# Patient Record
Sex: Female | Born: 1990 | Race: Black or African American | Hispanic: No | Marital: Single | Smoking: Never smoker
Health system: Southern US, Community
[De-identification: ages and names within clinical notes are randomized; demographics above are authoritative.]

---

## 2015-08-01 ENCOUNTER — Emergency Department (HOSPITAL_COMMUNITY): Payer: 59

## 2015-08-01 ENCOUNTER — Encounter (HOSPITAL_COMMUNITY): Payer: Self-pay | Admitting: *Deleted

## 2015-08-01 ENCOUNTER — Emergency Department (HOSPITAL_COMMUNITY)
Admission: EM | Admit: 2015-08-01 | Discharge: 2015-08-01 | Disposition: A | Discharge status: Home / Self Care | Payer: 59 | Attending: Emergency Medicine | Admitting: Emergency Medicine

## 2015-08-01 DIAGNOSIS — Z3202 Encounter for pregnancy test, result negative: Secondary | ICD-10-CM | POA: Diagnosis not present

## 2015-08-01 DIAGNOSIS — R1031 Right lower quadrant pain: Secondary | ICD-10-CM

## 2015-08-01 DIAGNOSIS — N83209 Unspecified ovarian cyst, unspecified side: Secondary | ICD-10-CM

## 2015-08-01 DIAGNOSIS — N200 Calculus of kidney: Secondary | ICD-10-CM | POA: Insufficient documentation

## 2015-08-01 DIAGNOSIS — N8300 Follicular cyst of ovary, unspecified side: Secondary | ICD-10-CM | POA: Diagnosis not present

## 2015-08-01 LAB — CBC
HEMATOCRIT: 39.4 % (ref 36.0–46.0)
HEMOGLOBIN: 13.2 g/dL (ref 12.0–15.0)
MCH: 28.4 pg (ref 26.0–34.0)
MCHC: 33.5 g/dL (ref 30.0–36.0)
MCV: 84.9 fL (ref 78.0–100.0)
Platelets: 234 10*3/uL (ref 150–400)
RBC: 4.64 MIL/uL (ref 3.87–5.11)
RDW: 12.9 % (ref 11.5–15.5)
WBC: 8.3 10*3/uL (ref 4.0–10.5)

## 2015-08-01 LAB — BASIC METABOLIC PANEL
Anion gap: 10 (ref 5–15)
BUN: 10 mg/dL (ref 6–20)
CHLORIDE: 107 mmol/L (ref 101–111)
CO2: 22 mmol/L (ref 22–32)
CREATININE: 0.84 mg/dL (ref 0.44–1.00)
Calcium: 9.7 mg/dL (ref 8.9–10.3)
GFR calc non Af Amer: >60 mL/min (ref >60)
Glucose, Bld: 109 mg/dL — ABNORMAL HIGH (ref 65–99)
POTASSIUM: 3.4 mmol/L — AB (ref 3.5–5.1)
SODIUM: 139 mmol/L (ref 135–145)

## 2015-08-01 LAB — URINE MICROSCOPIC-ADD ON

## 2015-08-01 LAB — URINALYSIS, ROUTINE W REFLEX MICROSCOPIC
BILIRUBIN URINE: NEGATIVE
Glucose, UA: NEGATIVE mg/dL
Ketones, ur: 15 mg/dL — AB
Leukocytes, UA: NEGATIVE
Nitrite: NEGATIVE
PH: 6 (ref 5.0–8.0)
Protein, ur: NEGATIVE mg/dL
SPECIFIC GRAVITY, URINE: 1.021 (ref 1.005–1.030)
Urobilinogen, UA: 0.2 mg/dL (ref 0.0–1.0)

## 2015-08-01 LAB — I-STAT BETA HCG BLOOD, ED (MC, WL, AP ONLY)

## 2015-08-01 MED ORDER — HYDROCODONE-ACETAMINOPHEN 5-325 MG PO TABS: 1.0000 | ORAL_TABLET | Freq: Four times a day (QID) | ORAL | Status: AC | PRN

## 2015-08-01 MED ORDER — HYDROMORPHONE HCL 1 MG/ML IJ SOLN
1.0000 mg | Freq: Once | INTRAMUSCULAR | Status: AC
  Administered 2015-08-01: 1 mg via INTRAVENOUS
  Filled 2015-08-01: qty 1

## 2015-08-01 MED ORDER — ONDANSETRON 4 MG PO TBDP: 4.0000 mg | ORAL_TABLET | Freq: Three times a day (TID) | ORAL | Status: AC | PRN

## 2015-08-01 MED ORDER — SODIUM CHLORIDE 0.9 % IV BOLUS (SEPSIS)
1000.0000 mL | Freq: Once | INTRAVENOUS | Status: AC
  Administered 2015-08-01: 1000 mL via INTRAVENOUS

## 2015-08-01 MED ORDER — ONDANSETRON HCL 4 MG/2ML IJ SOLN
4.0000 mg | Freq: Once | INTRAMUSCULAR | Status: AC
  Administered 2015-08-01: 4 mg via INTRAVENOUS
  Filled 2015-08-01: qty 2

## 2015-08-01 MED ORDER — KETOROLAC TROMETHAMINE 30 MG/ML IJ SOLN
30.0000 mg | Freq: Once | INTRAMUSCULAR | Status: AC
  Administered 2015-08-01: 30 mg via INTRAVENOUS
  Filled 2015-08-01: qty 1

## 2015-08-01 MED ORDER — IOHEXOL 300 MG/ML  SOLN
100.0000 mL | Freq: Once | INTRAMUSCULAR | Status: AC | PRN
  Administered 2015-08-01: 100 mL via INTRAVENOUS

## 2015-08-01 NOTE — ED Notes (Signed)
In room to medicate patient.  Not in room.  At ultrasound at this time.  To medicate on arrival back to room.

## 2015-08-01 NOTE — Discharge Instructions (Signed)
As discussed, with tonight's evaluation demonstrated the presence of both a hemorrhagic ovarian cyst, and a kidney stone, minor ongoing pain is to be expected.  However, if you develop new, or concerning changes in your condition is very important to follow-up here for further evaluation.  Otherwise, please follow-up with your physicians.

## 2015-08-01 NOTE — ED Notes (Signed)
Family requesting to see physician.  Dr. Jeraldine LootsLockwood made aware.

## 2015-08-01 NOTE — ED Provider Notes (Signed)
CSN: [id number]     Arrival date & time 08/01/15  1803 History   First MD Initiated Contact with Patient 08/01/15 1828     Chief Complaint  Patient presents with  . Flank Pain     (Consider location/radiation/quality/duration/timing/severity/associated sxs/prior Treatment) HPI Patient presents about 2 hours after the sudden onset of pain in the right lower quadrant. Pain is focally in the right lower quadrant, nonradiating, sore, severe, sharp. There is associated nausea, vomiting, no diarrhea, no dysuria, hematuria. Patient was well prior to the onset of pain. Patient acknowledges striking substantial amounts of alcohol yesterday. She denies a history of abdominal surgery. Patient has taken no medication for pain relief thus far, and there are no clear alleviating or exacerbating factors.  History reviewed. No pertinent past medical history. History reviewed. No pertinent past surgical history. No family history on file. Social History  Substance Use Topics  . Smoking status: Never Smoker   . Smokeless tobacco: None  . Alcohol Use: Yes     Comment: weekends   OB History    No data available     Review of Systems  Constitutional:       Per HPI, otherwise negative  HENT:       Per HPI, otherwise negative  Respiratory:       Per HPI, otherwise negative  Cardiovascular:       Per HPI, otherwise negative  Gastrointestinal: Positive for nausea and vomiting.  Endocrine:       Negative aside from HPI  Genitourinary:       Neg aside from HPI   Musculoskeletal:       Per HPI, otherwise negative  Skin: Negative.   Neurological: Negative for syncope.      Allergies  Review of patient's allergies indicates no known allergies.  Home Medications   Prior to Admission medications   Medication Sig Start Date End Date Taking? Authorizing Provider  HYDROcodone-acetaminophen (NORCO/VICODIN) 5-325 MG tablet Take 1 tablet by mouth every 6 (six) hours as needed for severe  pain. 08/01/15   Gerhard Munchobert Shekita Boyden, MD  ondansetron (ZOFRAN ODT) 4 MG disintegrating tablet Take 1 tablet (4 mg total) by mouth every 8 (eight) hours as needed for nausea or vomiting. 08/01/15   Gerhard Munchobert Jarrin Staley, MD   BP 110/96 mmHg  Pulse 80  Temp(Src) 98.7 F (37.1 C) (Oral)  Resp 18  SpO2 97%  LMP 07/18/2015 Physical Exam  Constitutional: She is oriented to person, place, and time. She appears well-developed and well-nourished. No distress.  HENT:  Head: Normocephalic and atraumatic.  Eyes: Conjunctivae and EOM are normal.  Cardiovascular: Normal rate and regular rhythm.   Pulmonary/Chest: Effort normal and breath sounds normal. No stridor. No respiratory distress.  Abdominal: She exhibits no distension. There is tenderness in the right lower quadrant.  Musculoskeletal: She exhibits no edema.  Neurological: She is alert and oriented to person, place, and time. No cranial nerve deficit.  Skin: Skin is warm and dry.  Psychiatric: She has a normal mood and affect.  Nursing note and vitals reviewed.   ED Course  Procedures (including critical care time) Labs Review Labs Reviewed  URINALYSIS, ROUTINE W REFLEX MICROSCOPIC (NOT [facility]) - Abnormal; Notable for the following:    APPearance CLOUDY (*)    Hgb urine dipstick LARGE (*)    Ketones, ur 15 (*)    All other components within normal limits  BASIC METABOLIC PANEL - Abnormal; Notable for the following:    Potassium 3.4 (*)  Glucose, Bld 109 (*)    All other components within normal limits  URINE MICROSCOPIC-ADD ON - Abnormal; Notable for the following:    Bacteria, UA FEW (*)    All other components within normal limits  CBC  I-STAT BETA HCG BLOOD, ED ([facility], [facility], [facility] ONLY)    Imaging Review US Transvaginal Non-ob  08/01/2015  CLINICAL DATA:  Acute onset RIGHT lower quadrant pain beginning at 5 p.m. RIGHT abdominal pelvic pain, assess for torsion. EXAM: TRANSABDOMINAL AND TRANSVAGINAL ULTRASOUND OF PELVIS DOPPLER ULTRASOUND  OF OVARIES TECHNIQUE: Both transabdominal and transvaginal ultrasound examinations of the pelvis were performed. Transabdominal technique was performed for global imaging of the pelvis including uterus, ovaries, adnexal regions, and pelvic cul-de-sac. It was necessary to proceed with endovaginal exam following the transabdominal exam to visualize the adnexa and endometrium. Color and duplex Doppler ultrasound was utilized to evaluate blood flow to the ovaries. COMPARISON:  CT abdomen and pelvis August 01, 2015 at 2056 hours FINDINGS: Uterus Measurements: 7.1 x 3.4 x 6.4 cm. No fibroids or other mass visualized. Endometrium Thickness: 7 mm.  No focal abnormality visualized. Right ovary Measurements: 3.2 x 2.3 x 3.2 cm. Normal appearance/no adnexal mass. Left ovary Measurements: 3.9 x 2.9 x 3.3 cm. Predominately anechoic LEFT adnexal 2.9 x 2.5 x 2.7 cm cyst with internal echoes most compatible with a hemorrhagic cyst. Pulsed Doppler evaluation of both ovaries demonstrates normal low-resistance arterial and venous waveforms. Other findings Small amount of free fluid in the pelvis, likely physiologic. IMPRESSION: No sonographic findings of ovarian torsion with particular attention to RIGHT ovary. 2.9 cm probable LEFT hemorrhagic cyst. Electronically Signed   By: Awilda Metro M.D.   On: 08/01/2015 22:45   US Pelvis Complete  08/01/2015  CLINICAL DATA:  Acute onset RIGHT lower quadrant pain beginning at 5 p.m. RIGHT abdominal pelvic pain, assess for torsion. EXAM: TRANSABDOMINAL AND TRANSVAGINAL ULTRASOUND OF PELVIS DOPPLER ULTRASOUND OF OVARIES TECHNIQUE: Both transabdominal and transvaginal ultrasound examinations of the pelvis were performed. Transabdominal technique was performed for global imaging of the pelvis including uterus, ovaries, adnexal regions, and pelvic cul-de-sac. It was necessary to proceed with endovaginal exam following the transabdominal exam to visualize the adnexa and endometrium. Color  and duplex Doppler ultrasound was utilized to evaluate blood flow to the ovaries. COMPARISON:  CT abdomen and pelvis August 01, 2015 at 2056 hours FINDINGS: Uterus Measurements: 7.1 x 3.4 x 6.4 cm. No fibroids or other mass visualized. Endometrium Thickness: 7 mm.  No focal abnormality visualized. Right ovary Measurements: 3.2 x 2.3 x 3.2 cm. Normal appearance/no adnexal mass. Left ovary Measurements: 3.9 x 2.9 x 3.3 cm. Predominately anechoic LEFT adnexal 2.9 x 2.5 x 2.7 cm cyst with internal echoes most compatible with a hemorrhagic cyst. Pulsed Doppler evaluation of both ovaries demonstrates normal low-resistance arterial and venous waveforms. Other findings Small amount of free fluid in the pelvis, likely physiologic. IMPRESSION: No sonographic findings of ovarian torsion with particular attention to RIGHT ovary. 2.9 cm probable LEFT hemorrhagic cyst. Electronically Signed   By: Awilda Metro M.D.   On: 08/01/2015 22:45   Ct Abdomen Pelvis W Contrast  08/01/2015  CLINICAL DATA:  Initial evaluation for acute right lower quadrant abdominal pain, nausea, vomiting. EXAM: CT ABDOMEN AND PELVIS WITH CONTRAST TECHNIQUE: Multidetector CT imaging of the abdomen and pelvis was performed using the standard protocol following bolus administration of intravenous contrast. CONTRAST:  OMNIPAQUE IOHEXOL 300 MG/ML  SOLN COMPARISON:  None. FINDINGS: Minimal subsegmental atelectasis seen dependently  in the left lung base. Visualized lungs are otherwise clear. No pleural or pericardial effusion. Liver demonstrates a normal contrast enhanced appearance. Gallbladder normal. No biliary dilatation. Spleen, adrenal glands, and pancreas demonstrate a normal contrast enhanced appearance. Left kidney unremarkable without evidence of nephrolithiasis or hydronephrosis. No focal enhancing renal mass. On the right, there is a somewhat delayed nephrogram as compared to the left. Additionally, there is mild fullness of the right  renal collecting system with mild right hydronephrosis. The right ureter is mildly dilated as compared to the left with periureteral fat stranding. There is an obstructive 4 mm calculus at the right UVJ. No other radiopaque stones seen along the course of the right renal collecting system or within the right kidney. Stomach within normal limits. No evidence for bowel obstruction. Appendix well visualized in the right lower quadrant and is of normal caliber and appearance without associated inflammatory changes to suggest acute appendicitis. No abnormal wall thickening, mucosal enhancement, or inflammatory fat stranding seen elsewhere about the bowels. Bladder is largely decompressed but grossly normal. Uterus within normal limits. Right ovary unremarkable. 3 cm cyst within the left ovary, likely a normal physiologic cyst. Small vault of free fluid within the pelvis, likely fib logic. No free air. No pathologically enlarged intra-abdominal or pelvic lymph nodes identified. Normal intravascular enhancement seen throughout the intra-abdominal aorta and its branch vessels. No acute osseous abnormality. No worrisome lytic or blastic osseous lesions. IMPRESSION: 1. 4 mm obstructive stone at the right UVJ with mild right hydroureteronephrosis. 2. No other acute intra-abdominal or pelvic process. Normal appendix. 3. Small volume free fluid within the pelvis, likely physiologic. Electronically Signed   By: Rise Mu M.D.   On: 08/01/2015 21:42   [place] Art/ven Flow Abd Pelv Doppler  08/01/2015  CLINICAL DATA:  Acute onset RIGHT lower quadrant pain beginning at 5 p.m. RIGHT abdominal pelvic pain, assess for torsion. EXAM: TRANSABDOMINAL AND TRANSVAGINAL ULTRASOUND OF PELVIS DOPPLER ULTRASOUND OF OVARIES TECHNIQUE: Both transabdominal and transvaginal ultrasound examinations of the pelvis were performed. Transabdominal technique was performed for global imaging of the pelvis including uterus, ovaries, adnexal  regions, and pelvic cul-de-sac. It was necessary to proceed with endovaginal exam following the transabdominal exam to visualize the adnexa and endometrium. Color and duplex Doppler ultrasound was utilized to evaluate blood flow to the ovaries. COMPARISON:  CT abdomen and pelvis August 01, 2015 at 2056 hours FINDINGS: Uterus Measurements: 7.1 x 3.4 x 6.4 cm. No fibroids or other mass visualized. Endometrium Thickness: 7 mm.  No focal abnormality visualized. Right ovary Measurements: 3.2 x 2.3 x 3.2 cm. Normal appearance/no adnexal mass. Left ovary Measurements: 3.9 x 2.9 x 3.3 cm. Predominately anechoic LEFT adnexal 2.9 x 2.5 x 2.7 cm cyst with internal echoes most compatible with a hemorrhagic cyst. Pulsed Doppler evaluation of both ovaries demonstrates normal low-resistance arterial and venous waveforms. Other findings Small amount of free fluid in the pelvis, likely physiologic. IMPRESSION: No sonographic findings of ovarian torsion with particular attention to RIGHT ovary. 2.9 cm probable LEFT hemorrhagic cyst. Electronically Signed   By: Awilda Metro M.D.   On: 08/01/2015 22:45   I have personally reviewed and evaluated these images and lab results as part of my medical decision-making.  On repeat exam after patient has returned from ultrasound and CT scan, I discussed all findings with her and her parents. Patient has the unusual combination of both kidney stone and hemorrhagic ovarian cyst. We had a lengthy conversation about likelihood of continuing pain, importance  of outpatient follow-up.    MDM   Final diagnoses:  Kidney stone  Hemorrhagic cyst of ovary    And female presents with acute onset pain. Patient is young, female, and with acute onset of pain, both appendicitis and ovarian torsion were considerations, patient was in substantial discomfort, but eventually with narcotics, fluids, Toradol, symptoms improved substantially. No evidence for bacteremia, sepsis, ovary and  torsion, appendicitis. However, the patient does have evidence for both new kidney stone, as well as hemorrhagic ovarian cyst. Patient discharged in stable condition  Gerhard Munch, MD 08/01/15 2330

## 2015-08-01 NOTE — ED Notes (Signed)
Patient unable to sit still fr vital signs.

## 2015-08-01 NOTE — ED Notes (Addendum)
Pt remains monitored by blood pressure and pulse ox. pts family remains at bedside.  Pt encouraged to provide urine specimen.

## 2015-08-01 NOTE — ED Notes (Signed)
Pt states acute onset R sided abdominal pain at 5 pm.  Emesis with pain.  States a lot of etoh last night.  No vaginal discharge.  LMP 10/16.

## 2016-06-28 IMAGING — CT CT ABD-PELV W/ CM
2 of 4 series · 15 of 46 positions shown, 17 images · IV contrast (APPLIED)
Comparison: None.

CLINICAL DATA: Initial evaluation for acute right lower quadrant
abdominal pain, nausea, vomiting.

EXAM:
CT ABDOMEN AND PELVIS WITH CONTRAST
TECHNIQUE: Multidetector CT imaging of the abdomen and pelvis was performed
using the standard protocol following bolus administration of
intravenous contrast.
CONTRAST:  100mL OMNIPAQUE IOHEXOL 300 MG/ML  SOLN

[Series 2: abd/ pelvis 5.0 i30f 1 · axial · 0.64mm/px · z∈[-466,-36]mm · 12 of 94 slices shown, 14 images]
[im 4/94  soft-tissue]
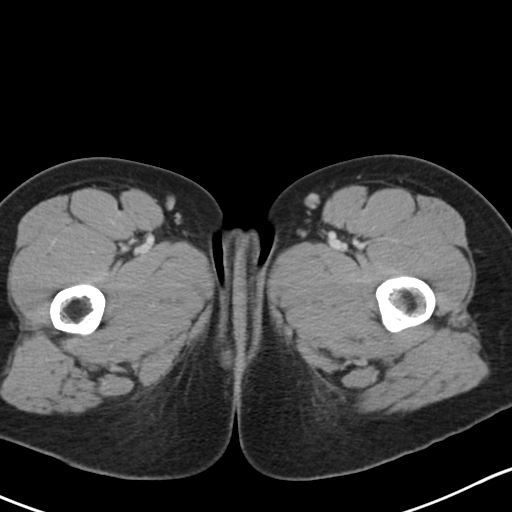
[im 4/94  bone]
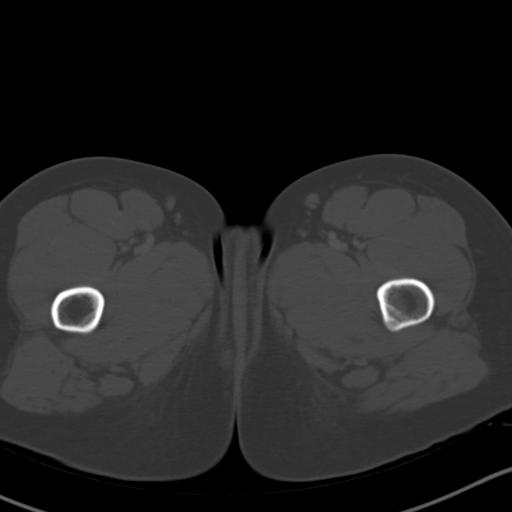
[im 12/94  soft-tissue]
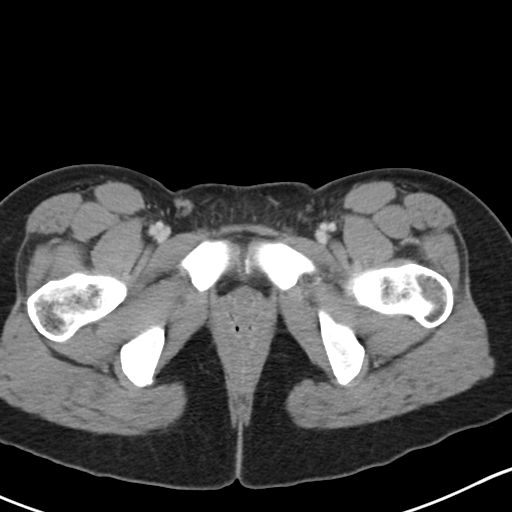
[im 20/94  soft-tissue]
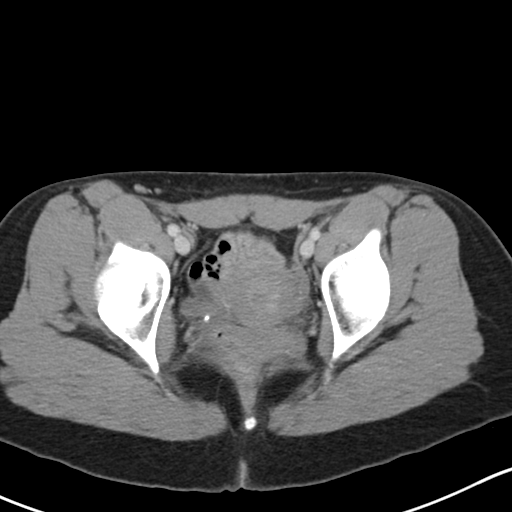
[im 28/94  soft-tissue]
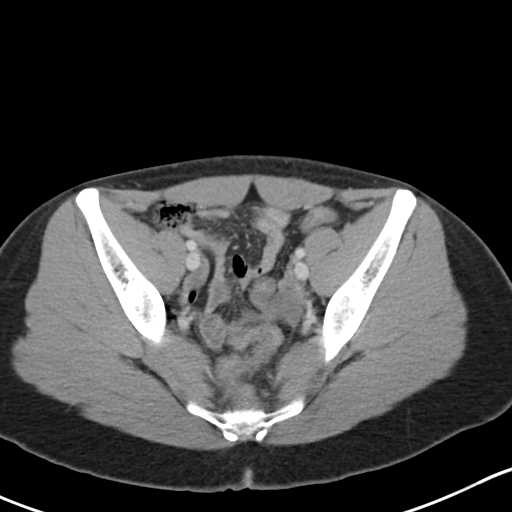
[im 35/94  soft-tissue]
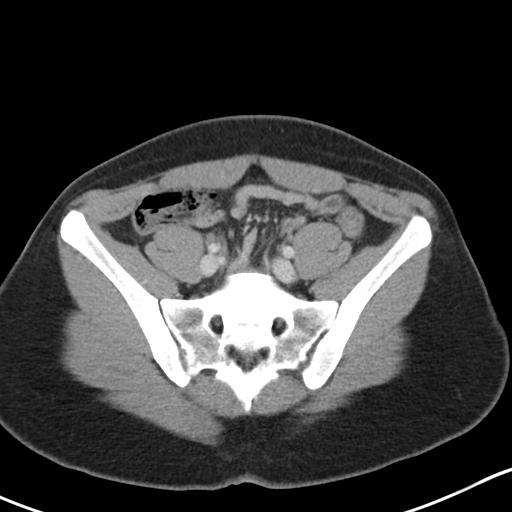
[im 43/94  soft-tissue]
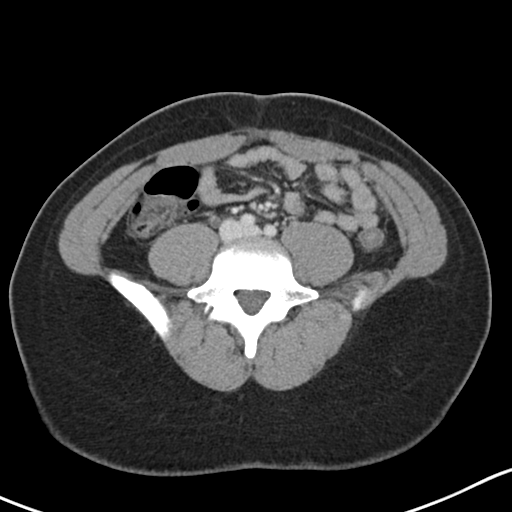
[im 51/94  soft-tissue]
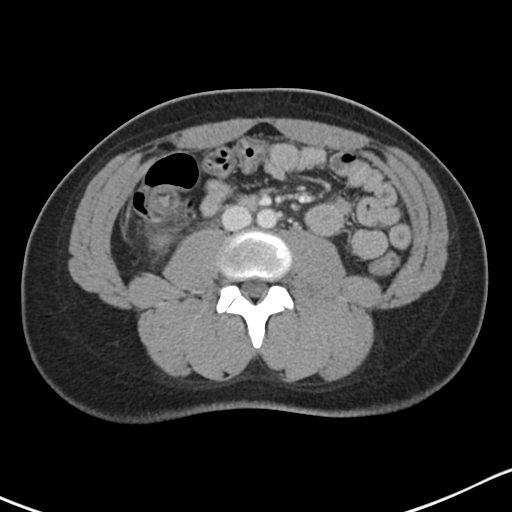
[im 59/94  soft-tissue]
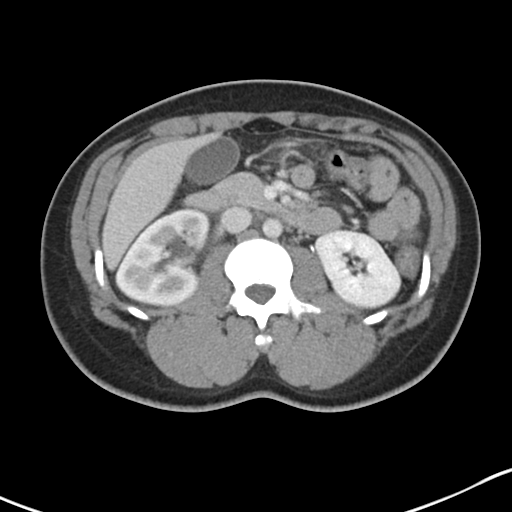
[im 66/94  soft-tissue]
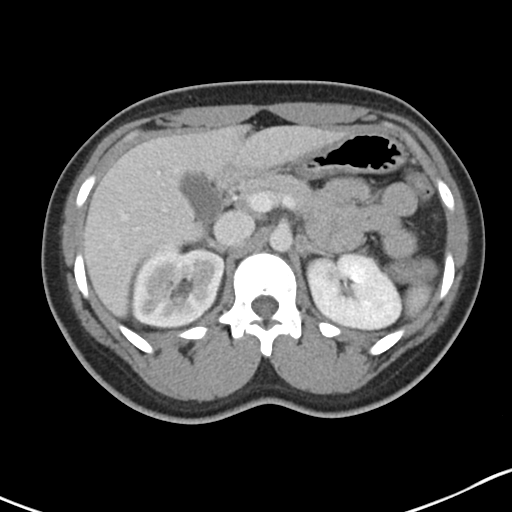
[im 66/94  bone]
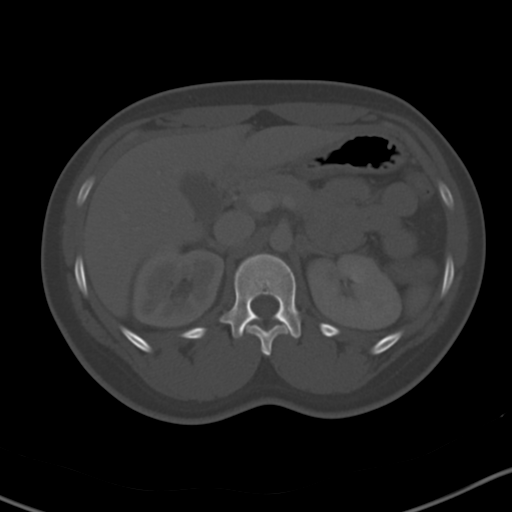
[im 74/94  soft-tissue]
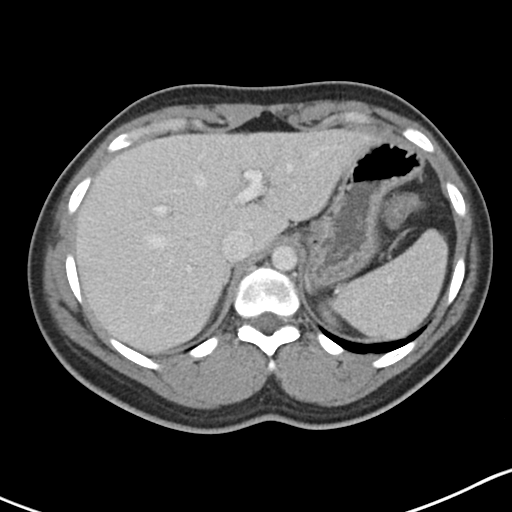
[im 82/94  soft-tissue]
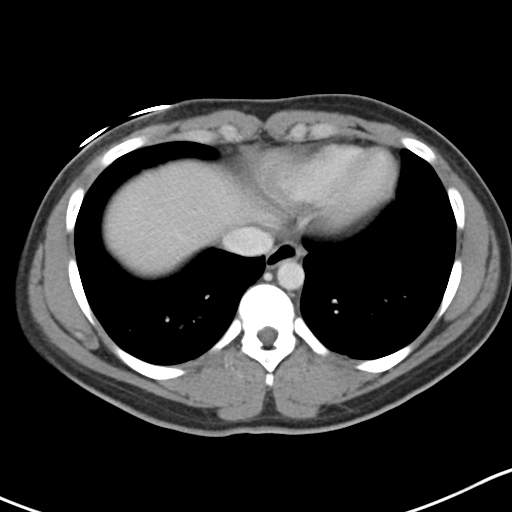
[im 90/94  soft-tissue]
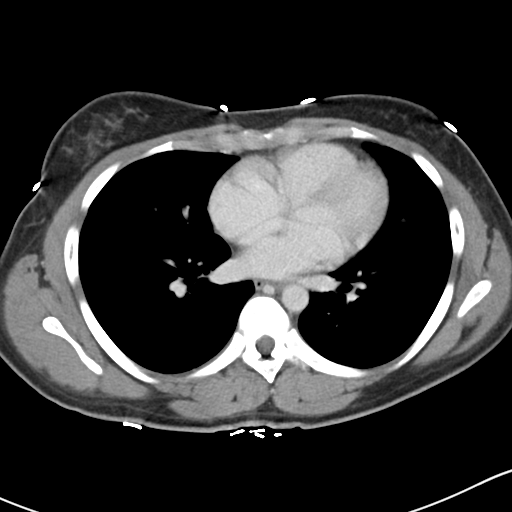

[Series 5: coronal soft tissue · coronal · 0.68mm/px · 3 of 72 slices shown]
[im 24/72  soft-tissue]
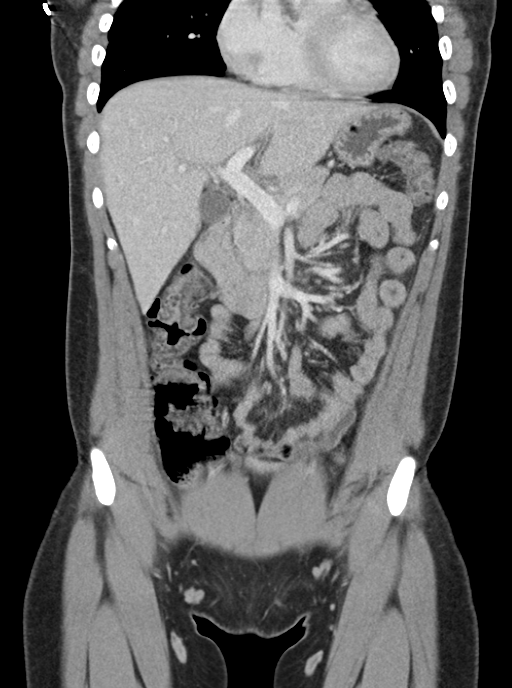
[im 32/72  soft-tissue]
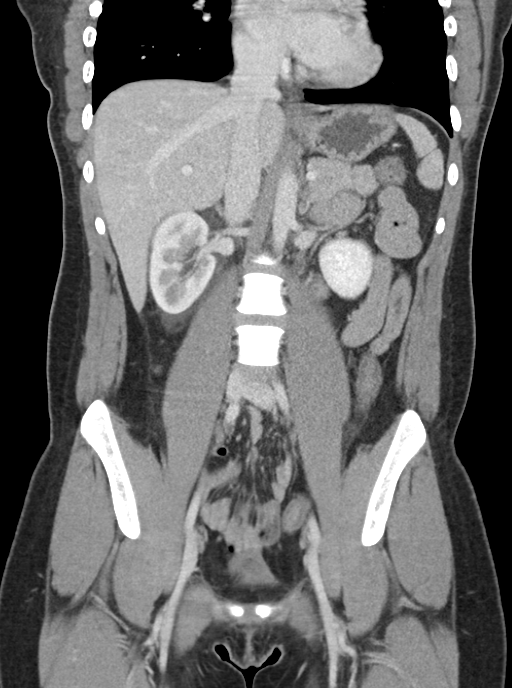
[im 40/72  soft-tissue]
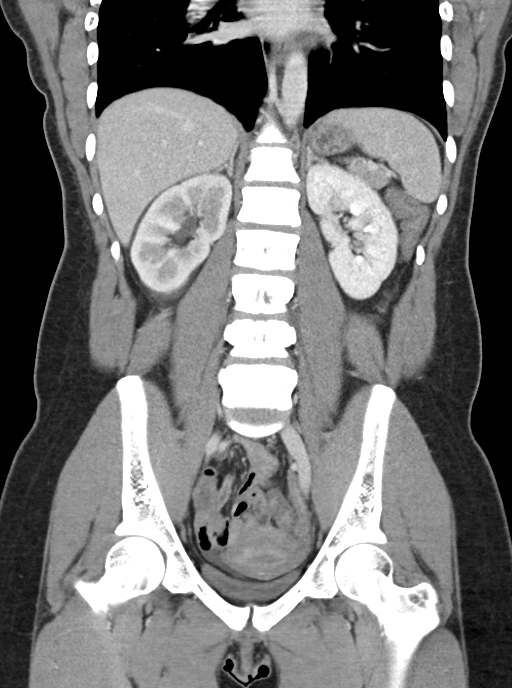

[15 of 46 positions shown; findings below may reference images not displayed]

FINDINGS: Minimal subsegmental atelectasis seen dependently in the left lung
base. Visualized lungs are otherwise clear. No pleural or
pericardial effusion.

Liver demonstrates a normal contrast enhanced appearance.
Gallbladder normal. No biliary dilatation. Spleen, adrenal glands,
and pancreas demonstrate a normal contrast enhanced appearance.

Left kidney unremarkable without evidence of nephrolithiasis or
hydronephrosis. No focal enhancing renal mass.

On the right, there is a somewhat delayed nephrogram as compared to
the left. Additionally, there is mild fullness of the right renal
collecting system with mild right hydronephrosis. The right ureter
is mildly dilated as compared to the left with periureteral fat
stranding. There is an obstructive 4 mm calculus at the right UVJ.
No other radiopaque stones seen along the course of the right renal
collecting system or within the right kidney.

Stomach within normal limits. No evidence for bowel obstruction.
Appendix well visualized in the right lower quadrant and is of
normal caliber and appearance without associated inflammatory
changes to suggest acute appendicitis. No abnormal wall thickening,
mucosal enhancement, or inflammatory fat stranding seen elsewhere
about the bowels.

Bladder is largely decompressed but grossly normal. Uterus within
normal limits. Right ovary unremarkable. 3 cm cyst within the left
ovary, likely a normal physiologic cyst.

Small vault of free fluid within the pelvis, likely fib logic. No
free air. No pathologically enlarged intra-abdominal or pelvic lymph
nodes identified. Normal intravascular enhancement seen throughout
the intra-abdominal aorta and its branch vessels.

No acute osseous abnormality. No worrisome lytic or blastic osseous
lesions.
IMPRESSION: 1. 4 mm obstructive stone at the right UVJ with mild right
hydroureteronephrosis.
2. No other acute intra-abdominal or pelvic process. Normal
appendix.
3. Small volume free fluid within the pelvis, likely physiologic.

## 2017-02-03 IMAGING — US US ART/VEN ABD/PELV/SCROTUM DOPPLER LTD
1 series · 13 of 25 positions shown · non-contrast
Comparison: CT abdomen and pelvis August 01, 2015 at 0166 hours

CLINICAL DATA: Acute onset RIGHT lower quadrant pain beginning at 5
p.m. RIGHT abdominal pelvic pain, assess for torsion.

EXAM:
TRANSABDOMINAL AND TRANSVAGINAL ULTRASOUND OF PELVIS
DOPPLER ULTRASOUND OF OVARIES
TECHNIQUE: Both transabdominal and transvaginal ultrasound examinations of the
pelvis were performed. Transabdominal technique was performed for
global imaging of the pelvis including uterus, ovaries, adnexal
regions, and pelvic cul-de-sac.
It was necessary to proceed with endovaginal exam following the
transabdominal exam to visualize the adnexa and endometrium. Color
and duplex Doppler ultrasound was utilized to evaluate blood flow to
the ovaries.

[Series 1: us art/ven abd/pelv/scrotum doppler ltd · 0.18mm/px · 13 of 55 slices shown]
[im 1/55]
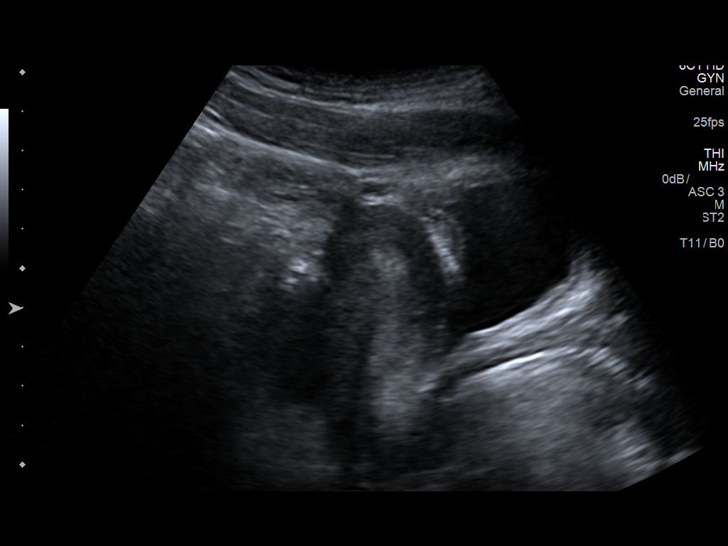
[im 5/55]
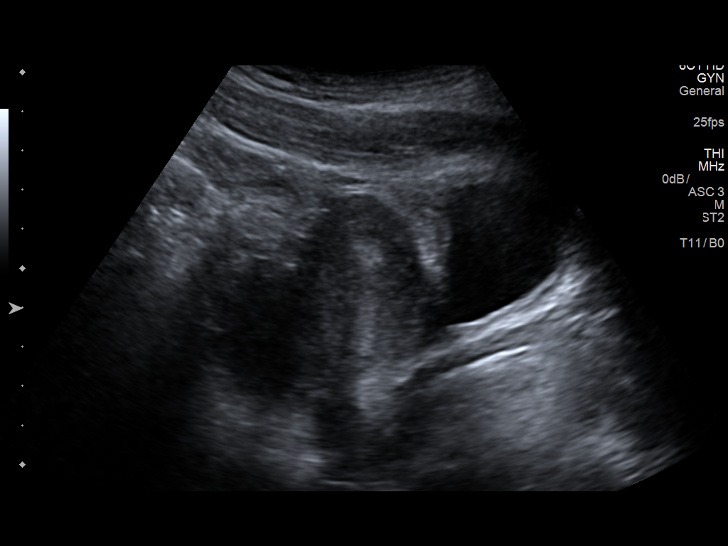
[im 10/55]
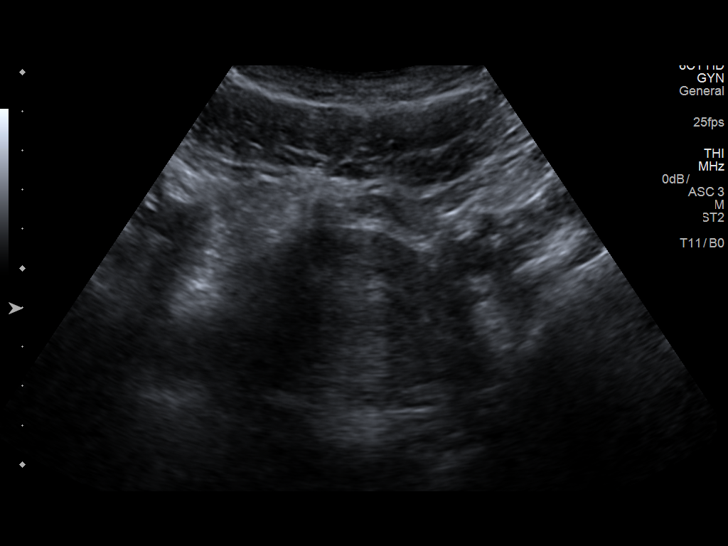
[im 14/55]
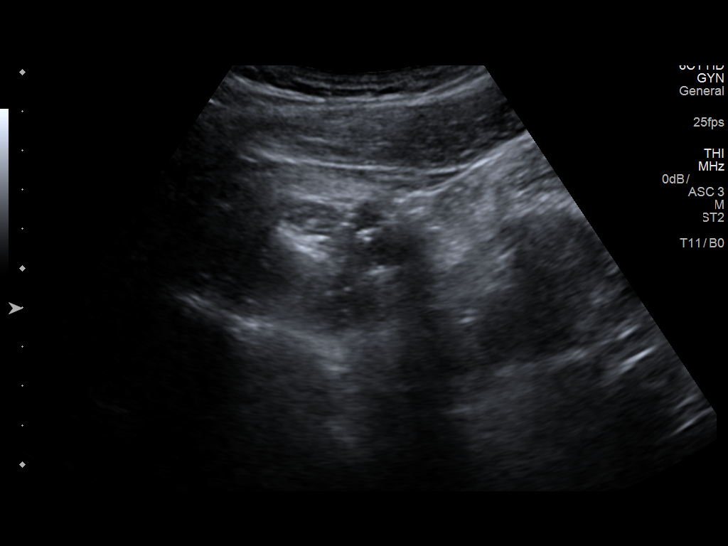
[im 19/55]
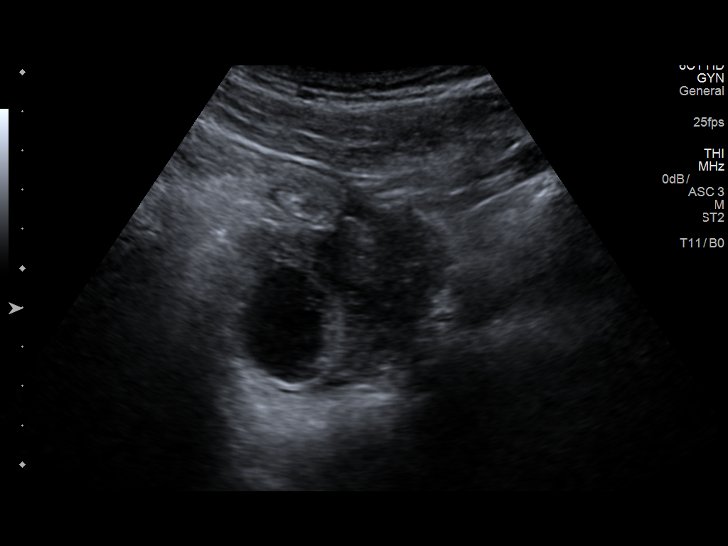
[im 23/55]
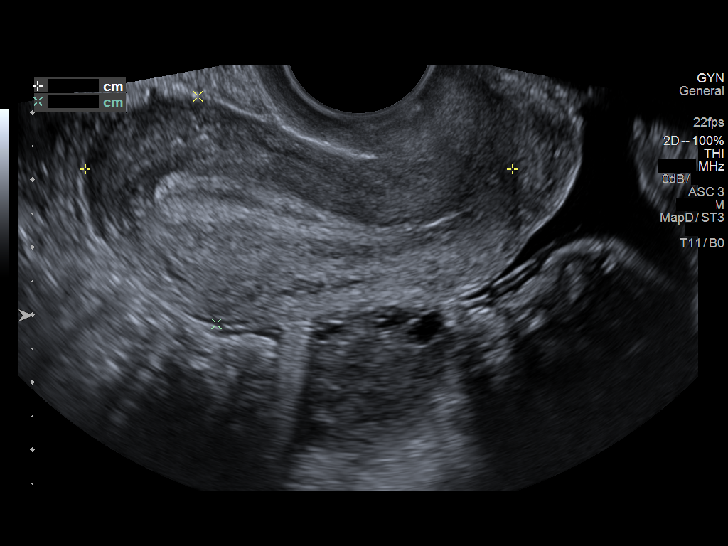
[im 28/55]
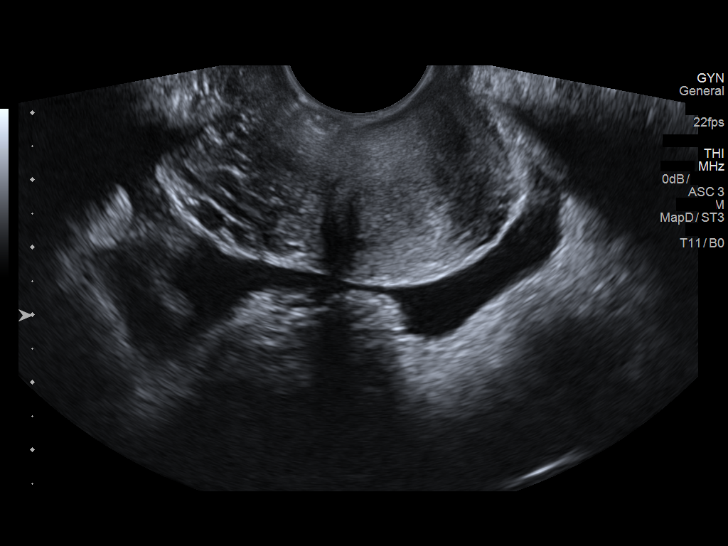
[im 32/55]
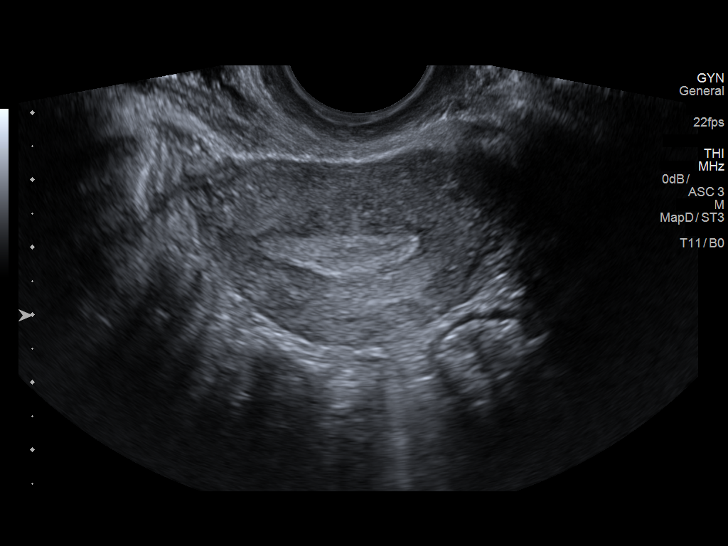
[im 37/55]
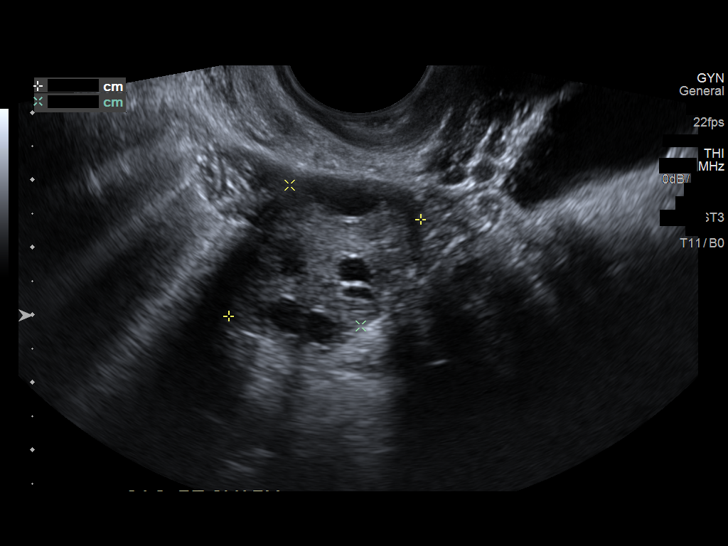
[im 41/55]
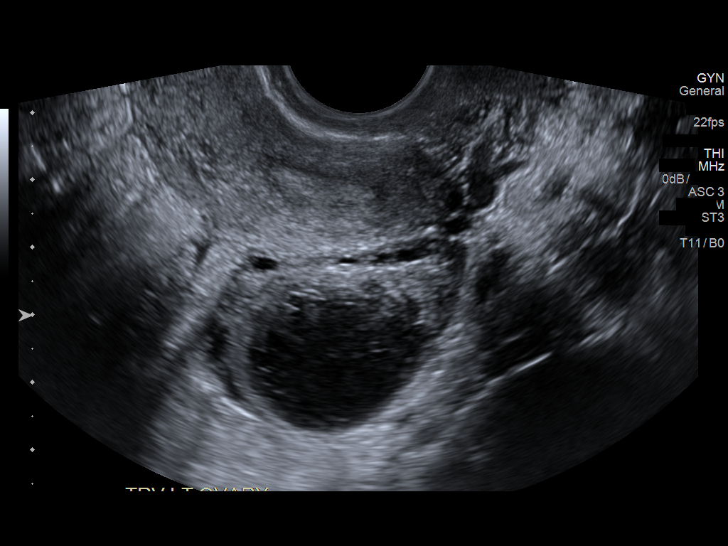
[im 46/55]
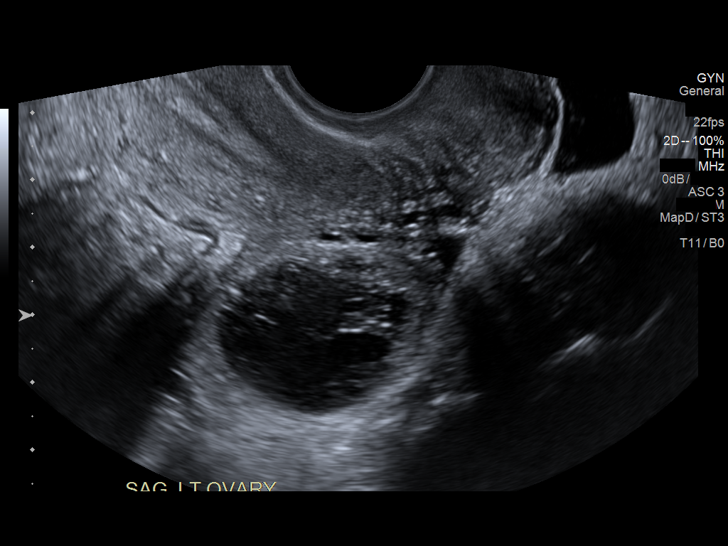
[im 50/55]
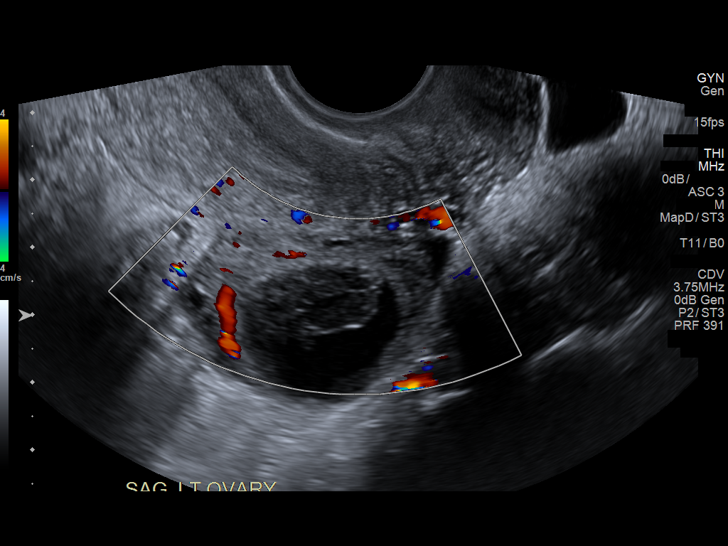
[im 55/55]
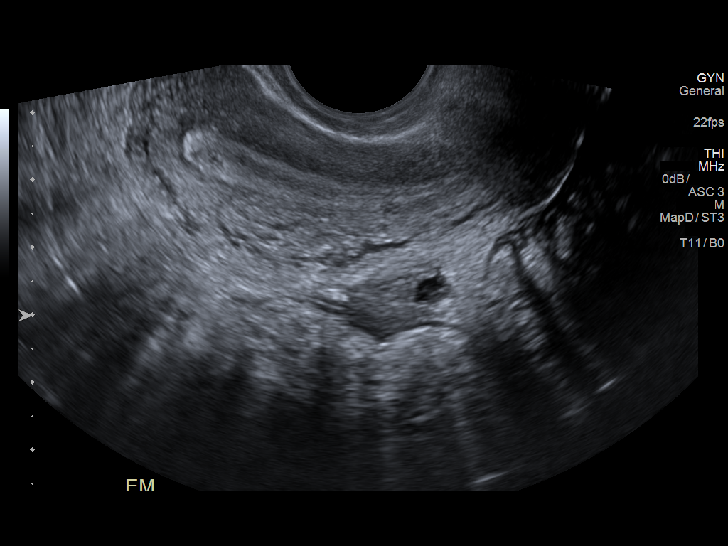

[13 of 25 positions shown; findings below may reference images not displayed]

FINDINGS: Uterus

Measurements: 7.1 x 3.4 x 6.4 cm. No fibroids or other mass
visualized.

Endometrium

Thickness: 7 mm.  No focal abnormality visualized.

Right ovary

Measurements: 3.2 x 2.3 x 3.2 cm. Normal appearance/no adnexal mass.

Left ovary

Measurements: 3.9 x 2.9 x 3.3 cm. Predominately anechoic LEFT
adnexal 2.9 x 2.5 x 2.7 cm cyst with internal echoes most compatible
with a hemorrhagic cyst.

Pulsed Doppler evaluation of both ovaries demonstrates normal
low-resistance arterial and venous waveforms.

Other findings

Small amount of free fluid in the pelvis, likely physiologic.
IMPRESSION: No sonographic findings of ovarian torsion with particular attention
to RIGHT ovary.

2.9 cm probable LEFT hemorrhagic cyst.
